# Patient Record
Sex: Male | Born: 1970 | Race: White | Hispanic: No | Smoking: Never smoker
Health system: Southern US, Community
[De-identification: ages and names within clinical notes are randomized; demographics above are authoritative.]

## PROBLEM LIST (undated history)

## (undated) DIAGNOSIS — B009 Herpesviral infection, unspecified: Secondary | ICD-10-CM

## (undated) DIAGNOSIS — R0683 Snoring: Secondary | ICD-10-CM

## (undated) DIAGNOSIS — J45909 Unspecified asthma, uncomplicated: Secondary | ICD-10-CM

## (undated) DIAGNOSIS — E7800 Pure hypercholesterolemia, unspecified: Secondary | ICD-10-CM

## (undated) DIAGNOSIS — E785 Hyperlipidemia, unspecified: Secondary | ICD-10-CM

## (undated) DIAGNOSIS — J302 Other seasonal allergic rhinitis: Secondary | ICD-10-CM

## (undated) DIAGNOSIS — N200 Calculus of kidney: Secondary | ICD-10-CM

## (undated) DIAGNOSIS — Z8249 Family history of ischemic heart disease and other diseases of the circulatory system: Secondary | ICD-10-CM

## (undated) DIAGNOSIS — I10 Essential (primary) hypertension: Secondary | ICD-10-CM

## (undated) DIAGNOSIS — E669 Obesity, unspecified: Secondary | ICD-10-CM

## (undated) HISTORY — DX: Hyperlipidemia, unspecified: E78.5

## (undated) HISTORY — PX: OTHER SURGICAL HISTORY: SHX169

## (undated) HISTORY — DX: Pure hypercholesterolemia, unspecified: E78.00

## (undated) HISTORY — DX: Snoring: R06.83

## (undated) HISTORY — DX: Obesity, unspecified: E66.9

## (undated) HISTORY — DX: Herpesviral infection, unspecified: B00.9

## (undated) HISTORY — DX: Essential (primary) hypertension: I10

## (undated) HISTORY — DX: Family history of ischemic heart disease and other diseases of the circulatory system: Z82.49

## (undated) HISTORY — DX: Other seasonal allergic rhinitis: J30.2

## (undated) HISTORY — DX: Unspecified asthma, uncomplicated: J45.909

---

## 1998-04-19 ENCOUNTER — Emergency Department (HOSPITAL_COMMUNITY): Admission: EM | Admit: 1998-04-19 | Discharge: 1998-04-20 | Payer: Self-pay | Admitting: Emergency Medicine

## 1999-10-26 ENCOUNTER — Other Ambulatory Visit: Admission: RE | Admit: 1999-10-26 | Discharge: 1999-10-26 | Payer: Self-pay | Admitting: Urology

## 2001-01-30 ENCOUNTER — Emergency Department (HOSPITAL_COMMUNITY): Admission: EM | Admit: 2001-01-30 | Discharge: 2001-01-30 | Payer: Self-pay | Admitting: Emergency Medicine

## 2003-04-05 ENCOUNTER — Emergency Department (HOSPITAL_COMMUNITY): Admission: EM | Admit: 2003-04-05 | Discharge: 2003-04-05 | Payer: Self-pay | Admitting: Emergency Medicine

## 2004-02-28 ENCOUNTER — Emergency Department (HOSPITAL_COMMUNITY): Admission: EM | Admit: 2004-02-28 | Discharge: 2004-02-28 | Payer: Self-pay | Admitting: Emergency Medicine

## 2006-12-13 ENCOUNTER — Emergency Department (HOSPITAL_COMMUNITY): Admission: EM | Admit: 2006-12-13 | Discharge: 2006-12-13 | Payer: Self-pay | Admitting: Emergency Medicine

## 2006-12-13 ENCOUNTER — Encounter (INDEPENDENT_AMBULATORY_CARE_PROVIDER_SITE_OTHER): Payer: Self-pay | Admitting: Emergency Medicine

## 2008-09-04 ENCOUNTER — Emergency Department (HOSPITAL_COMMUNITY): Admission: EM | Admit: 2008-09-04 | Discharge: 2008-09-04 | Payer: Self-pay | Admitting: Emergency Medicine

## 2010-04-16 IMAGING — CT CT ABDOMEN W/O CM
2 of 4 series · 17 of 46 positions shown, 19 images · non-contrast
Comparison: None

CT ABDOMEN

CLINICAL DATA: Left flank pain

CT OF THE ABDOMEN AND PELVIS WITHOUT CONTRAST (CT UROGRAM)
TECHNIQUE: Multidetector CT imaging was performed through the
abdomen and pelvis to include the urinary tract.

[Series 2: stone_wo 5.0 b40f st · axial · 0.75mm/px · z∈[-420,-60]mm · 14 of 78 slices shown, 16 images]
[im 3/78  soft-tissue]
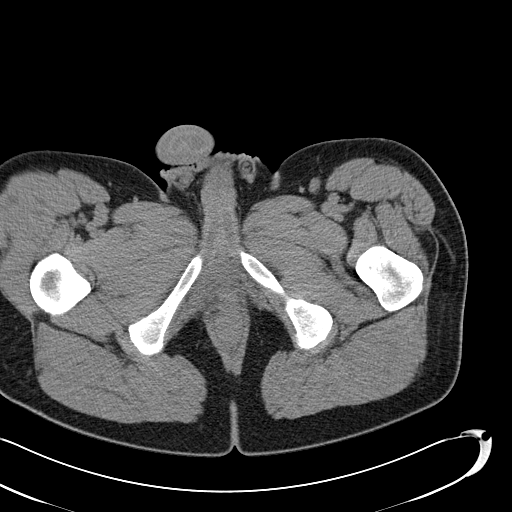
[im 3/78  bone]
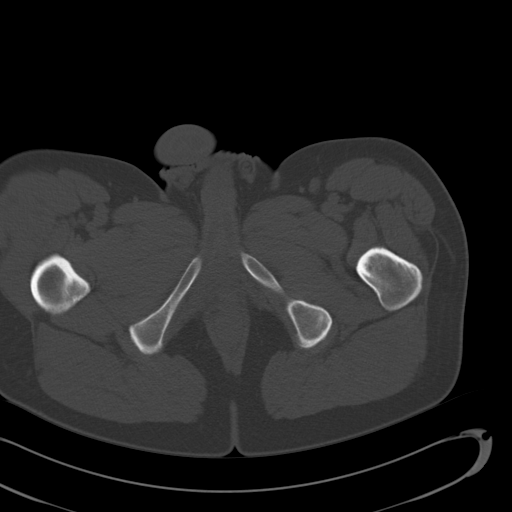
[im 9/78  soft-tissue]
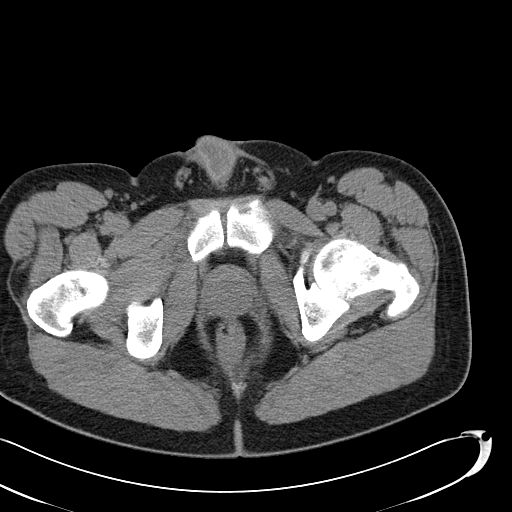
[im 14/78  soft-tissue]
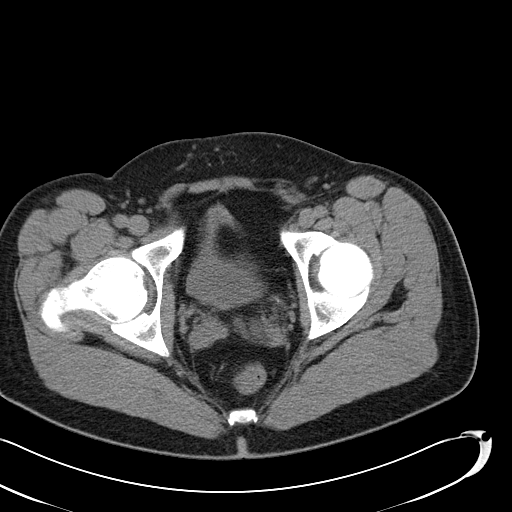
[im 20/78  soft-tissue]
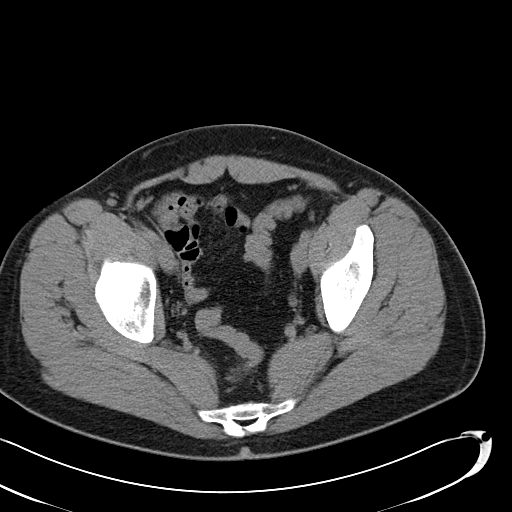
[im 25/78  soft-tissue]
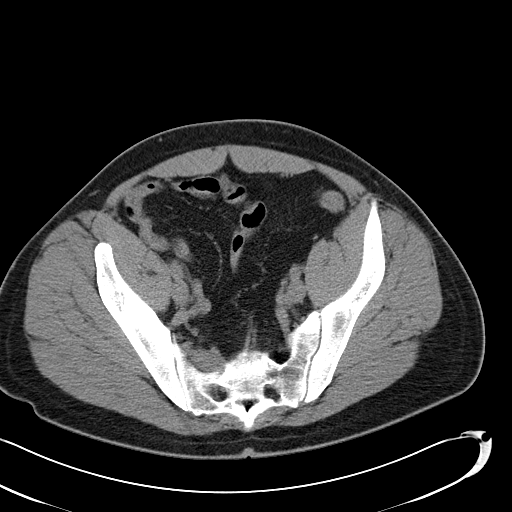
[im 31/78  soft-tissue]
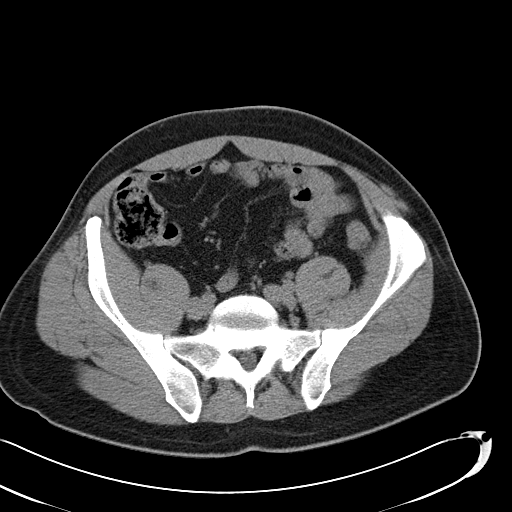
[im 36/78  soft-tissue]
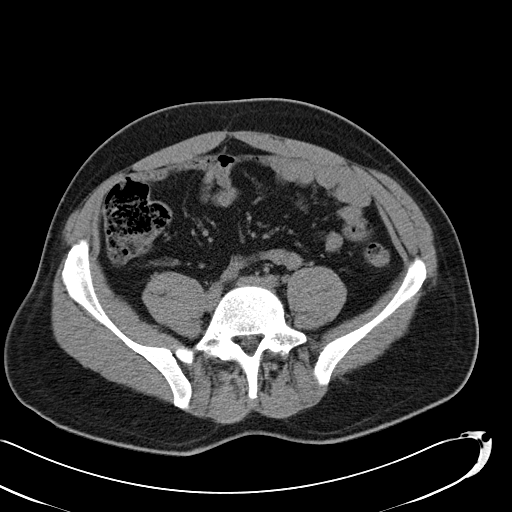
[im 42/78  soft-tissue]
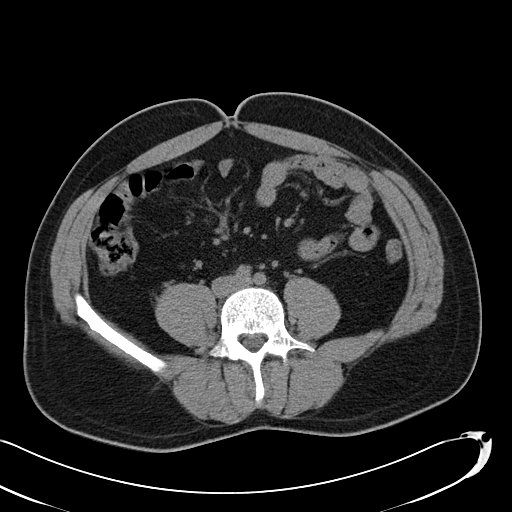
[im 47/78  soft-tissue]
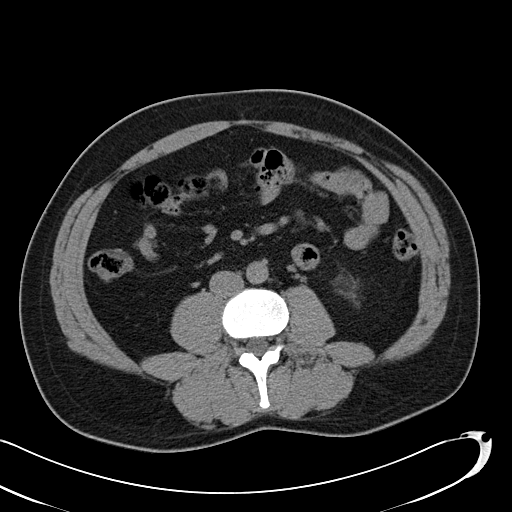
[im 47/78  bone]
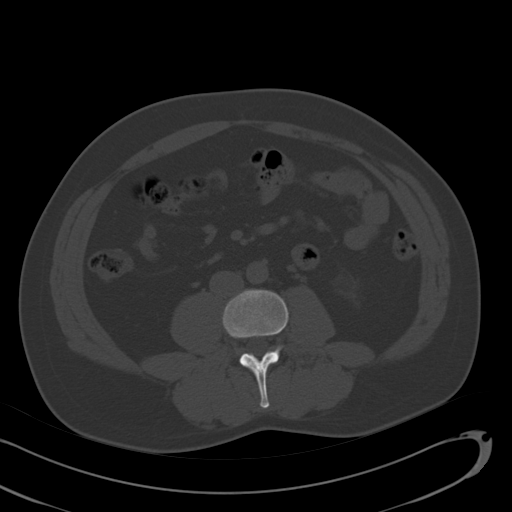
[im 53/78  soft-tissue]
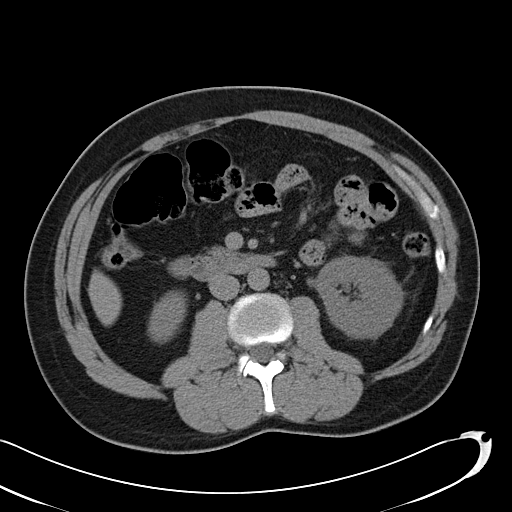
[im 58/78  soft-tissue]
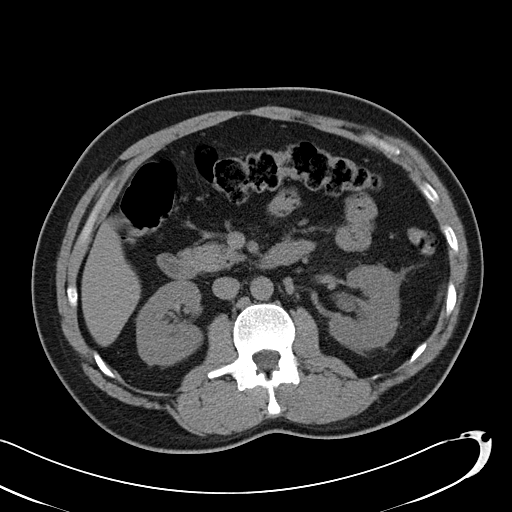
[im 64/78  soft-tissue]
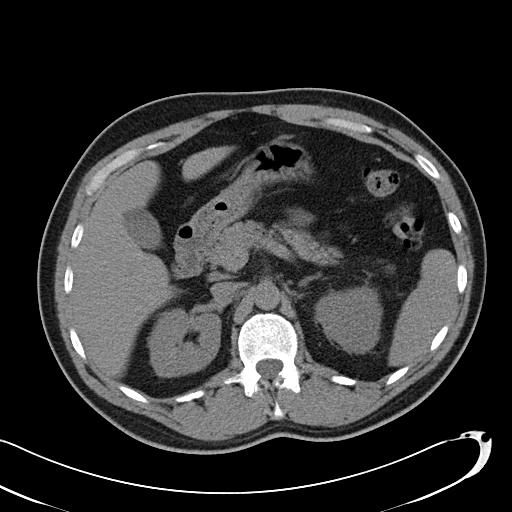
[im 69/78  soft-tissue]
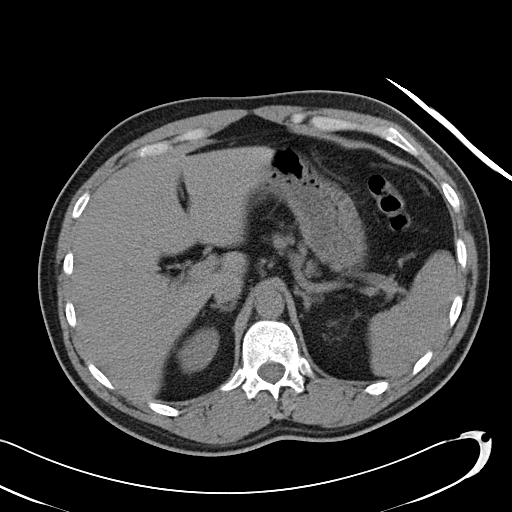
[im 75/78  soft-tissue]
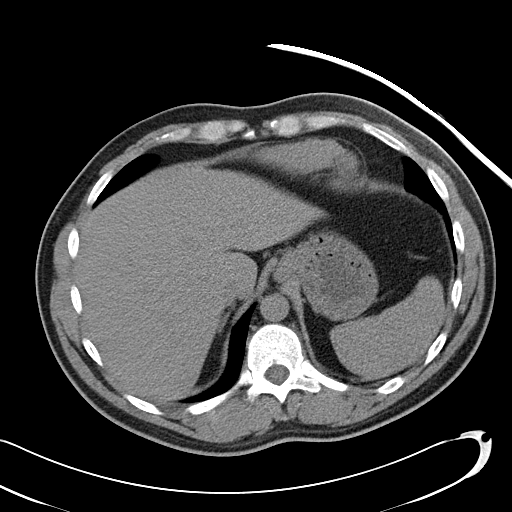

[Series 602: coronal · coronal · 0.79mm/px · 3 of 72 slices shown]
[im 24/72  soft-tissue]
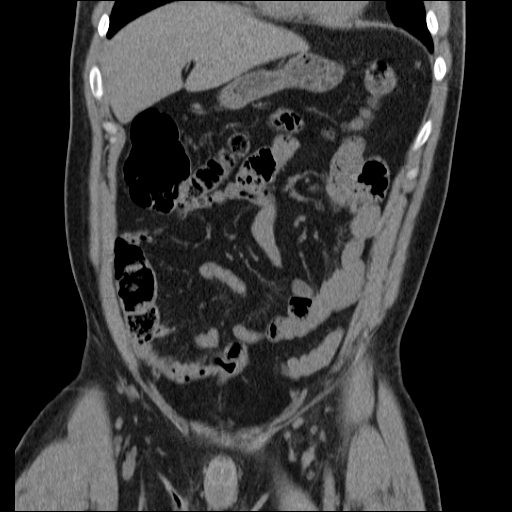
[im 32/72  soft-tissue]
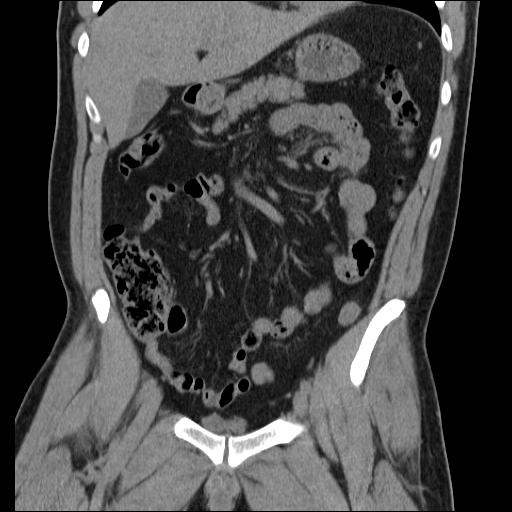
[im 40/72  soft-tissue]
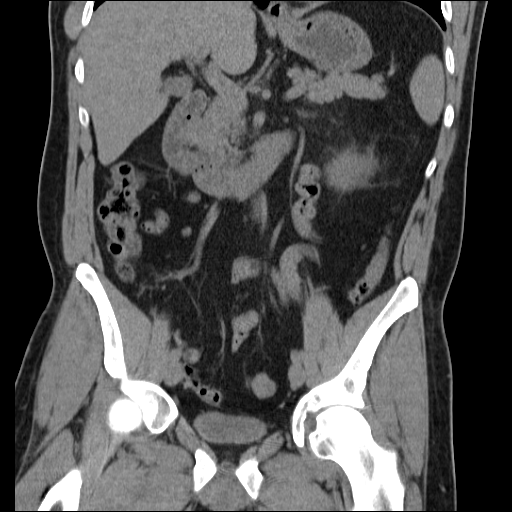

[17 of 46 positions shown; findings below may reference images not displayed]

FINDINGS: Mild to moderate left hydroureteronephrosis.  Findings
are compatible with a distal left ureteral calculus.  No renal
parenchymal abnormality.  Left perirenal soft tissue stranding
secondary to the obstruction.
IMPRESSION: Acute left urinary tract obstruction.  Findings consistent with a
distal left ureteral stone.  CT pelvis to follow.

CT PELVIS
FINDINGS: 3 mm calculus in the distal left ureter (image 65).
Prostate gland and seminal vesicles unremarkable.
IMPRESSION: Small calculus in the distal left ureter.

## 2010-09-08 ENCOUNTER — Emergency Department (HOSPITAL_COMMUNITY)
Admission: EM | Admit: 2010-09-08 | Discharge: 2010-09-08 | Disposition: A | Discharge status: Home / Self Care | Payer: BC Managed Care – PPO | Attending: Emergency Medicine | Admitting: Emergency Medicine

## 2010-09-08 DIAGNOSIS — W57XXXA Bitten or stung by nonvenomous insect and other nonvenomous arthropods, initial encounter: Secondary | ICD-10-CM | POA: Insufficient documentation

## 2010-09-08 DIAGNOSIS — S1096XA Insect bite of unspecified part of neck, initial encounter: Secondary | ICD-10-CM | POA: Insufficient documentation

## 2010-09-08 DIAGNOSIS — S30860A Insect bite (nonvenomous) of lower back and pelvis, initial encounter: Secondary | ICD-10-CM | POA: Insufficient documentation

## 2010-09-08 DIAGNOSIS — S40269A Insect bite (nonvenomous) of unspecified shoulder, initial encounter: Secondary | ICD-10-CM | POA: Insufficient documentation

## 2010-09-20 LAB — URINE MICROSCOPIC-ADD ON

## 2010-09-20 LAB — URINALYSIS, ROUTINE W REFLEX MICROSCOPIC
Bilirubin Urine: NEGATIVE
Glucose, UA: NEGATIVE mg/dL
Leukocytes, UA: NEGATIVE
Nitrite: NEGATIVE
Protein, ur: 30 mg/dL — AB
Specific Gravity, Urine: 1.028 (ref 1.005–1.030)
Urobilinogen, UA: 1 mg/dL (ref 0.0–1.0)
pH: 7 (ref 5.0–8.0)

## 2011-07-30 ENCOUNTER — Telehealth: Payer: Self-pay

## 2011-07-30 NOTE — Telephone Encounter (Signed)
.  [facility]     PT IS REQUESTING REFILL FOR HERPES   BEST PHONE [phone]

## 2011-07-30 NOTE — Telephone Encounter (Signed)
Spoke with patient, needs his refill on Valtrex.  Last seen in August for CPE.  Please rx to [facility], and contact patient.

## 2011-07-31 ENCOUNTER — Other Ambulatory Visit: Payer: Self-pay

## 2011-07-31 MED ORDER — VALACYCLOVIR HCL 500 MG PO TABS: 500.0000 mg | ORAL_TABLET | Freq: Two times a day (BID) | ORAL | Status: DC

## 2011-07-31 NOTE — Telephone Encounter (Signed)
Please pull chart and re-route to PA pool  

## 2011-07-31 NOTE — Telephone Encounter (Signed)
Chart already pulled and taken to clinical station for fax rx request.

## 2011-08-09 ENCOUNTER — Telehealth: Payer: Self-pay

## 2011-08-09 NOTE — Telephone Encounter (Signed)
Pt has some questions about herpes he would like answered

## 2011-08-10 NOTE — Telephone Encounter (Signed)
PT WAS CONCERNED ABOUT HOW HERPES IS CONTRACTED.  EXPLANATION GIVEN.  PT WAS PRESCRIBED VIAGRA IN AUGUST AND WOULD LIKE TO TRY CIALIS INSTEAD. PLEASE ADVISE.  CHART IS AT NURSES STATION FOR REVIEW

## 2011-08-12 NOTE — Telephone Encounter (Signed)
Can switch to cialis 10 mg take 1 one hour prior to intercourse #10

## 2011-08-13 NOTE — Telephone Encounter (Signed)
Advised pt that we can call in Rx for Cialis for him and verified pharmacy. Called into [facility] as written below by Helmut Muster

## 2011-10-30 ENCOUNTER — Telehealth: Payer: Self-pay

## 2011-10-30 NOTE — Telephone Encounter (Signed)
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 After researching the disease online he became concerned that it may cause cancer. Please advise.

## 2011-10-31 NOTE — Telephone Encounter (Signed)
Dr Georgiana Shore, you saw the pt for CPE 01/10/11, and his chart is in your box. What would you like me to tell the pt?

## 2011-10-31 NOTE — Telephone Encounter (Signed)
Herpes does not cause cancer.

## 2011-11-01 NOTE — Telephone Encounter (Signed)
Pt CB and I gave him the message from Dr Georgiana Shore. He thanked [place] for the call and info

## 2011-11-01 NOTE — Telephone Encounter (Signed)
LMOM to CB. 

## 2012-03-10 ENCOUNTER — Other Ambulatory Visit: Payer: Self-pay | Admitting: Family Medicine

## 2012-03-10 NOTE — Telephone Encounter (Signed)
Chart pulled to PA pool at nurse station DOS 01/10/11

## 2013-09-26 ENCOUNTER — Other Ambulatory Visit: Payer: Self-pay | Admitting: Family Medicine

## 2014-04-30 ENCOUNTER — Encounter (HOSPITAL_COMMUNITY): Payer: Self-pay | Admitting: Nurse Practitioner

## 2014-04-30 ENCOUNTER — Emergency Department (HOSPITAL_COMMUNITY)
Admission: EM | Admit: 2014-04-30 | Discharge: 2014-04-30 | Disposition: A | Discharge status: Home / Self Care | Payer: BC Managed Care – PPO | Attending: Emergency Medicine | Admitting: Emergency Medicine

## 2014-04-30 ENCOUNTER — Emergency Department (HOSPITAL_COMMUNITY): Payer: BC Managed Care – PPO

## 2014-04-30 DIAGNOSIS — N200 Calculus of kidney: Secondary | ICD-10-CM | POA: Insufficient documentation

## 2014-04-30 DIAGNOSIS — R001 Bradycardia, unspecified: Secondary | ICD-10-CM | POA: Diagnosis not present

## 2014-04-30 DIAGNOSIS — R109 Unspecified abdominal pain: Secondary | ICD-10-CM

## 2014-04-30 DIAGNOSIS — R1031 Right lower quadrant pain: Secondary | ICD-10-CM | POA: Diagnosis present

## 2014-04-30 DIAGNOSIS — Z8619 Personal history of other infectious and parasitic diseases: Secondary | ICD-10-CM | POA: Insufficient documentation

## 2014-04-30 HISTORY — DX: Herpesviral infection, unspecified: B00.9

## 2014-04-30 HISTORY — DX: Calculus of kidney: N20.0

## 2014-04-30 LAB — COMPREHENSIVE METABOLIC PANEL
ALT: 42 U/L (ref 0–53)
AST: 31 U/L (ref 0–37)
Albumin: 4 g/dL (ref 3.5–5.2)
Alkaline Phosphatase: 85 U/L (ref 39–117)
Anion gap: 14 (ref 5–15)
BUN: 13 mg/dL (ref 6–23)
CO2: 25 mEq/L (ref 19–32)
Calcium: 9.2 mg/dL (ref 8.4–10.5)
Chloride: 102 mEq/L (ref 96–112)
Creatinine, Ser: 1.19 mg/dL (ref 0.50–1.35)
GFR calc Af Amer: 86 mL/min — ABNORMAL LOW (ref >90)
GFR calc non Af Amer: 74 mL/min — ABNORMAL LOW (ref >90)
Glucose, Bld: 117 mg/dL — ABNORMAL HIGH (ref 70–99)
Potassium: 3.9 mEq/L (ref 3.7–5.3)
Sodium: 141 mEq/L (ref 137–147)
Total Bilirubin: 0.8 mg/dL (ref 0.3–1.2)
Total Protein: 7.4 g/dL (ref 6.0–8.3)

## 2014-04-30 LAB — CBC WITH DIFFERENTIAL/PLATELET
Basophils Absolute: 0 10*3/uL (ref 0.0–0.1)
Basophils Relative: 1 % (ref 0–1)
Eosinophils Absolute: 0.3 10*3/uL (ref 0.0–0.7)
Eosinophils Relative: 5 % (ref 0–5)
HCT: 45.6 % (ref 39.0–52.0)
Hemoglobin: 16 g/dL (ref 13.0–17.0)
Lymphocytes Relative: 35 % (ref 12–46)
Lymphs Abs: 2.2 10*3/uL (ref 0.7–4.0)
MCH: 33.8 pg (ref 26.0–34.0)
MCHC: 35.1 g/dL (ref 30.0–36.0)
MCV: 96.2 fL (ref 78.0–100.0)
Monocytes Absolute: 0.5 10*3/uL (ref 0.1–1.0)
Monocytes Relative: 8 % (ref 3–12)
Neutro Abs: 3.3 10*3/uL (ref 1.7–7.7)
Neutrophils Relative %: 51 % (ref 43–77)
Platelets: 230 10*3/uL (ref 150–400)
RBC: 4.74 MIL/uL (ref 4.22–5.81)
RDW: 12.7 % (ref 11.5–15.5)
WBC: 6.4 10*3/uL (ref 4.0–10.5)

## 2014-04-30 LAB — URINE MICROSCOPIC-ADD ON

## 2014-04-30 LAB — URINALYSIS, ROUTINE W REFLEX MICROSCOPIC
Glucose, UA: NEGATIVE mg/dL
Ketones, ur: 15 mg/dL — AB
Leukocytes, UA: NEGATIVE
Nitrite: NEGATIVE
Protein, ur: 30 mg/dL — AB
Specific Gravity, Urine: 1.034 — ABNORMAL HIGH (ref 1.005–1.030)
Urobilinogen, UA: 1 mg/dL (ref 0.0–1.0)
pH: 5.5 (ref 5.0–8.0)

## 2014-04-30 MED ORDER — ONDANSETRON HCL 4 MG/2ML IJ SOLN
4.0000 mg | Freq: Once | INTRAMUSCULAR | Status: AC
  Administered 2014-04-30: 4 mg via INTRAVENOUS
  Filled 2014-04-30: qty 2

## 2014-04-30 MED ORDER — KETOROLAC TROMETHAMINE 30 MG/ML IJ SOLN
30.0000 mg | Freq: Once | INTRAMUSCULAR | Status: AC
  Administered 2014-04-30: 30 mg via INTRAVENOUS
  Filled 2014-04-30: qty 1

## 2014-04-30 MED ORDER — IBUPROFEN 600 MG PO TABS: 600.0000 mg | ORAL_TABLET | Freq: Four times a day (QID) | ORAL | Status: DC | PRN

## 2014-04-30 MED ORDER — HYDROMORPHONE HCL 1 MG/ML IJ SOLN
1.0000 mg | Freq: Once | INTRAMUSCULAR | Status: AC
  Administered 2014-04-30: 1 mg via INTRAVENOUS
  Filled 2014-04-30: qty 1

## 2014-04-30 MED ORDER — ONDANSETRON HCL 4 MG/2ML IJ SOLN
INTRAMUSCULAR | Status: AC
  Filled 2014-04-30: qty 2

## 2014-04-30 MED ORDER — OXYCODONE-ACETAMINOPHEN 5-325 MG PO TABS: 1.0000 | ORAL_TABLET | Freq: Four times a day (QID) | ORAL | Status: DC | PRN

## 2014-04-30 MED ORDER — ONDANSETRON HCL 4 MG PO TABS: 4.0000 mg | ORAL_TABLET | Freq: Four times a day (QID) | ORAL | Status: DC

## 2014-04-30 NOTE — ED Provider Notes (Signed)
CSN: [id number]     Arrival date & time 04/30/14  1507 History   First MD Initiated Contact with Patient 04/30/14 1516     Chief Complaint  Patient presents with  . Abdominal Pain     (Consider location/radiation/quality/duration/timing/severity/associated sxs/prior Treatment) HPI   Patient to the ER with complaints of RLQ abdominal pain, radiates down into the left testicle, that started 1 hour prior to arrival. It is associated with vomiting and feels like a previous kidney stone that he has had on the right side as well. He has not had any chest pain, diarrhea constipation, dysuria, penile discharge. He denies having intra-abdominal surgery in the past.   Past Medical History  Diagnosis Date  . Kidney stone   . Herpes    History reviewed. No pertinent past surgical history. History reviewed. No pertinent family history. History  Substance Use Topics  . Smoking status: Never Smoker   . Smokeless tobacco: Not on file  . Alcohol Use: Yes    Review of Systems  10 Systems reviewed and are negative for acute change except as noted in the HPI.    Allergies  Review of patient's allergies indicates no known allergies.  Home Medications   Prior to Admission medications   Medication Sig Start Date End Date Taking? Authorizing Provider  naproxen sodium (ANAPROX) 220 MG tablet Take 440 mg by mouth once.   Yes Historical Provider, MD  ibuprofen (ADVIL,MOTRIN) 600 MG tablet Take 1 tablet (600 mg total) by mouth every 6 (six) hours as needed. 04/30/14   Dorse Locy Irine SealG Persis Graffius, PA-C  ondansetron (ZOFRAN) 4 MG tablet Take 1 tablet (4 mg total) by mouth every 6 (six) hours. 04/30/14   Zurri Rudden Irine SealG Burrel Legrand, PA-C  oxyCODONE-acetaminophen (PERCOCET/ROXICET) 5-325 MG per tablet Take 1-2 tablets by mouth every 6 (six) hours as needed. 04/30/14   Siana Panameno Irine SealG Wasil Wolke, PA-C   BP 113/76 mmHg  Pulse 45  Temp(Src) 97.5 F (36.4 C) (Oral)  Resp 15  SpO2 98% Physical Exam  Constitutional: He appears  well-developed and well-nourished. He appears distressed (crying with pain).  HENT:  Head: Normocephalic and atraumatic.  Eyes: Pupils are equal, round, and reactive to light.  Neck: Normal range of motion. Neck supple.  Cardiovascular: Regular rhythm.  Bradycardia present.   Pulmonary/Chest: Effort normal.  Abdominal: Soft. Bowel sounds are normal. He exhibits no distension and no fluid wave. There is tenderness. There is CVA tenderness (right). There is no guarding.    Neurological: He is alert.  Skin: Skin is warm and dry.  Nursing note and vitals reviewed.     ED Course  Procedures (including critical care time) Labs Review Labs Reviewed  COMPREHENSIVE METABOLIC PANEL - Abnormal; Notable for the following:    Glucose, Bld 117 (*)    GFR calc non Af Amer 74 (*)    GFR calc Af Amer 86 (*)    All other components within normal limits  URINALYSIS, ROUTINE W REFLEX MICROSCOPIC - Abnormal; Notable for the following:    Color, Urine AMBER (*)    APPearance TURBID (*)    Specific Gravity, Urine 1.034 (*)    Hgb urine dipstick LARGE (*)    Bilirubin Urine SMALL (*)    Ketones, ur 15 (*)    Protein, ur 30 (*)    All other components within normal limits  URINE MICROSCOPIC-ADD ON - Abnormal; Notable for the following:    Bacteria, UA MANY (*)    All other components within normal limits  CBC WITH DIFFERENTIAL    Imaging Review Ct Abdomen Pelvis Wo Contrast  04/30/2014   CLINICAL DATA:  Acute onset right lower quadrant pain about 2 p.m. today.  EXAM: CT ABDOMEN AND PELVIS WITHOUT CONTRAST  TECHNIQUE: Multidetector CT imaging of the abdomen and pelvis was performed following the standard protocol without IV contrast.  COMPARISON:  09/04/2008  FINDINGS: Lung bases are clear.  No effusions.  Heart is normal size.  Liver, gallbladder, spleen, pancreas, adrenals have an unremarkable unenhanced appearance.  There is mild right hydronephrosis and perinephric stranding. Probable punctate  1 mm stone at the right ureterovesical junction. This is best seen on coronal image 50. Punctate 1-2 mm stone in the upper pole of the right kidney. No renal or ureteral stones on the left.  Appendix is visualized and is normal. Stomach, large and small bowel are unremarkable. No free fluid, free air or adenopathy.  No acute bony abnormality or focal bone lesion.  IMPRESSION: Mild right hydronephrosis and perinephric stranding. Probable punctate 1 mm stone at the right ureterovesical junction.  Right nephrolithiasis.   Electronically Signed   By: Charlett NoseKevin  Dover M.D.   On: 04/30/2014 17:08     EKG Interpretation   Date/Time:  Saturday April 30 2014 15:35:38 EST Ventricular Rate:  46 PR Interval:  191 QRS Duration: 95 QT Interval:  475 QTC Calculation: 415 R Axis:   56 Text Interpretation:  Sinus bradycardia Atrial premature complex Baseline  wander in lead(s) V4 No prior for comparison Confirmed by Gwendolyn GrantWALDEN  MD,  BLAIR ([id number]) on 04/30/2014 4:30:47 PM      MDM   Final diagnoses:  Right flank pain  Kidney stone    Patient has a small stone in the right UVJ ( 1 mm) with mild hydronephrosis. NO infection in his urine but he does have WBC's. Will culture urine.  Medications  ondansetron (ZOFRAN) injection 4 mg (4 mg Intravenous Given 04/30/14 1537)  HYDROmorphone (DILAUDID) injection 1 mg (1 mg Intravenous Given 04/30/14 1537)  ketorolac (TORADOL) 30 MG/ML injection 30 mg (30 mg Intravenous Given 04/30/14 1735)    Pain easily controlled in the ED.  ibuprofen (ADVIL,MOTRIN) 600 MG tablet Take 1 tablet (600 mg total) by mouth every 6 (six) hours as needed. 30 tablet Dorthula Matasiffany G Sakura Denis, PA-C   ondansetron (ZOFRAN) 4 MG tablet Take 1 tablet (4 mg total) by mouth every 6 (six) hours. 12 tablet Dorthula Matasiffany G Claribel Sachs, PA-C   oxyCODONE-acetaminophen (PERCOCET/ROXICET) 5-325 MG per tablet Take 1-2 tablets by mouth every 6 (six) hours as needed. 20 tablet Dorthula Matasiffany G Deonta Bomberger, PA-C   43 y.o.Austin RoundFrancis J  Ho's evaluation in the Emergency Department is complete. It has been determined that no acute conditions requiring further emergency intervention are present at this time. The patient/guardian have been advised of the diagnosis and plan. We have discussed signs and symptoms that warrant return to the ED, such as changes or worsening in symptoms.  Vital signs are stable at discharge. Filed Vitals:   04/30/14 1815  BP: 113/76  Pulse: 45  Temp:   Resp: 15    Patient/guardian has voiced understanding and agreed to follow-up with the PCP or specialist.      Dorthula Matasiffany G Amberrose Friebel, PA-C 04/30/14 1838  Elwin MochaBlair Walden, MD 04/30/14 2150

## 2014-04-30 NOTE — Discharge Instructions (Signed)

## 2014-04-30 NOTE — ED Notes (Signed)
He reports sudden onset RLQ pain and vomiting 1 hour ago. Feels like when he had a kidney stone

## 2015-04-18 ENCOUNTER — Encounter (HOSPITAL_COMMUNITY): Payer: Self-pay | Admitting: Emergency Medicine

## 2015-04-18 ENCOUNTER — Emergency Department (HOSPITAL_COMMUNITY)
Admission: EM | Admit: 2015-04-18 | Discharge: 2015-04-18 | Disposition: A | Discharge status: Home / Self Care | Payer: BLUE CROSS/BLUE SHIELD | Attending: Emergency Medicine | Admitting: Emergency Medicine

## 2015-04-18 DIAGNOSIS — R109 Unspecified abdominal pain: Secondary | ICD-10-CM

## 2015-04-18 DIAGNOSIS — Z87442 Personal history of urinary calculi: Secondary | ICD-10-CM | POA: Insufficient documentation

## 2015-04-18 DIAGNOSIS — N390 Urinary tract infection, site not specified: Secondary | ICD-10-CM | POA: Diagnosis not present

## 2015-04-18 DIAGNOSIS — R1031 Right lower quadrant pain: Secondary | ICD-10-CM | POA: Diagnosis present

## 2015-04-18 DIAGNOSIS — Z8619 Personal history of other infectious and parasitic diseases: Secondary | ICD-10-CM | POA: Diagnosis not present

## 2015-04-18 DIAGNOSIS — Z79899 Other long term (current) drug therapy: Secondary | ICD-10-CM | POA: Insufficient documentation

## 2015-04-18 DIAGNOSIS — R319 Hematuria, unspecified: Secondary | ICD-10-CM | POA: Diagnosis not present

## 2015-04-18 LAB — COMPREHENSIVE METABOLIC PANEL
ALBUMIN: 3.9 g/dL (ref 3.5–5.0)
ALT: 68 U/L — AB (ref 17–63)
ANION GAP: 10 (ref 5–15)
AST: 42 U/L — ABNORMAL HIGH (ref 15–41)
Alkaline Phosphatase: 65 U/L (ref 38–126)
BUN: 17 mg/dL (ref 6–20)
CHLORIDE: 104 mmol/L (ref 101–111)
CO2: 26 mmol/L (ref 22–32)
CREATININE: 1.42 mg/dL — AB (ref 0.61–1.24)
Calcium: 8.9 mg/dL (ref 8.9–10.3)
GFR calc non Af Amer: 59 mL/min — ABNORMAL LOW (ref >60)
GLUCOSE: 126 mg/dL — AB (ref 65–99)
Potassium: 3.7 mmol/L (ref 3.5–5.1)
SODIUM: 140 mmol/L (ref 135–145)
Total Bilirubin: 1.2 mg/dL (ref 0.3–1.2)
Total Protein: 7 g/dL (ref 6.5–8.1)

## 2015-04-18 LAB — URINALYSIS, ROUTINE W REFLEX MICROSCOPIC
Glucose, UA: NEGATIVE mg/dL
Ketones, ur: 40 mg/dL — AB
Nitrite: POSITIVE — AB
PROTEIN: 100 mg/dL — AB
SPECIFIC GRAVITY, URINE: 1.037 — AB (ref 1.005–1.030)
UROBILINOGEN UA: 4 mg/dL — AB (ref 0.0–1.0)
pH: 5 (ref 5.0–8.0)

## 2015-04-18 LAB — CBC WITH DIFFERENTIAL/PLATELET
BASOS PCT: 0 %
Basophils Absolute: 0 10*3/uL (ref 0.0–0.1)
Eosinophils Absolute: 0.4 10*3/uL (ref 0.0–0.7)
Eosinophils Relative: 4 %
HCT: 45.9 % (ref 39.0–52.0)
Hemoglobin: 16 g/dL (ref 13.0–17.0)
LYMPHS PCT: 39 %
Lymphs Abs: 3.7 10*3/uL (ref 0.7–4.0)
MCH: 33.5 pg (ref 26.0–34.0)
MCHC: 34.9 g/dL (ref 30.0–36.0)
MCV: 96.2 fL (ref 78.0–100.0)
MONO ABS: 0.9 10*3/uL (ref 0.1–1.0)
MONOS PCT: 9 %
NEUTROS ABS: 4.4 10*3/uL (ref 1.7–7.7)
Neutrophils Relative %: 48 %
PLATELETS: 269 10*3/uL (ref 150–400)
RBC: 4.77 MIL/uL (ref 4.22–5.81)
RDW: 13 % (ref 11.5–15.5)
WBC: 9.4 10*3/uL (ref 4.0–10.5)

## 2015-04-18 LAB — URINE MICROSCOPIC-ADD ON

## 2015-04-18 LAB — LIPASE, BLOOD: Lipase: 39 U/L (ref 11–51)

## 2015-04-18 MED ORDER — FENTANYL CITRATE (PF) 100 MCG/2ML IJ SOLN
100.0000 ug | Freq: Once | INTRAMUSCULAR | Status: AC
  Administered 2015-04-18: 100 ug via INTRAVENOUS
  Filled 2015-04-18: qty 2

## 2015-04-18 MED ORDER — FENTANYL CITRATE (PF) 100 MCG/2ML IJ SOLN
INTRAMUSCULAR | Status: AC
  Administered 2015-04-18: 100 ug via INTRAVENOUS
  Filled 2015-04-18: qty 2

## 2015-04-18 MED ORDER — ONDANSETRON 4 MG PO TBDP
ORAL_TABLET | ORAL | Status: AC
  Filled 2015-04-18: qty 1

## 2015-04-18 MED ORDER — ONDANSETRON HCL 4 MG/2ML IJ SOLN
4.0000 mg | Freq: Once | INTRAMUSCULAR | Status: AC
  Administered 2015-04-18: 4 mg via INTRAVENOUS
  Filled 2015-04-18: qty 2

## 2015-04-18 MED ORDER — ONDANSETRON 4 MG PO TBDP
4.0000 mg | ORAL_TABLET | Freq: Once | ORAL | Status: AC
  Administered 2015-04-18: 4 mg via ORAL

## 2015-04-18 MED ORDER — SODIUM CHLORIDE 0.9 % IV BOLUS (SEPSIS)
1000.0000 mL | Freq: Once | INTRAVENOUS | Status: AC
  Administered 2015-04-18: 1000 mL via INTRAVENOUS

## 2015-04-18 MED ORDER — OXYCODONE-ACETAMINOPHEN 5-325 MG PO TABS: 1.0000 | ORAL_TABLET | Freq: Three times a day (TID) | ORAL | Status: AC | PRN

## 2015-04-18 MED ORDER — CEFTRIAXONE SODIUM 1 G IJ SOLR
1.0000 g | Freq: Once | INTRAMUSCULAR | Status: AC
  Administered 2015-04-18: 1 g via INTRAVENOUS
  Filled 2015-04-18: qty 10

## 2015-04-18 MED ORDER — OXYCODONE-ACETAMINOPHEN 5-325 MG PO TABS
1.0000 | ORAL_TABLET | Freq: Once | ORAL | Status: AC
  Administered 2015-04-18: 1 via ORAL

## 2015-04-18 MED ORDER — OXYCODONE-ACETAMINOPHEN 5-325 MG PO TABS
2.0000 | ORAL_TABLET | Freq: Once | ORAL | Status: DC
Start: 2015-04-18 — End: 2015-04-18

## 2015-04-18 MED ORDER — CIPROFLOXACIN HCL 500 MG PO TABS: 500.0000 mg | ORAL_TABLET | Freq: Two times a day (BID) | ORAL | Status: AC

## 2015-04-18 MED ORDER — OXYCODONE-ACETAMINOPHEN 5-325 MG PO TABS
ORAL_TABLET | ORAL | Status: AC
  Filled 2015-04-18: qty 1

## 2015-04-18 MED ORDER — FENTANYL CITRATE (PF) 100 MCG/2ML IJ SOLN
100.0000 ug | Freq: Once | INTRAMUSCULAR | Status: AC
  Administered 2015-04-18: 100 ug via INTRAVENOUS

## 2015-04-18 NOTE — ED Provider Notes (Signed)
CSN: [id number]     Arrival date & time 04/18/15  0302 History   First MD Initiated Contact with Patient 04/18/15 0456     Chief Complaint  Patient presents with  . Flank Pain     Patient is a 44 y.o. male presenting with flank pain. The history is provided by the patient.  Flank Pain This is a new problem. Episode onset: several hours ago. The problem occurs constantly. The problem has been gradually worsening. Associated symptoms include abdominal pain. Pertinent negatives include no chest pain and no shortness of breath. Nothing aggravates the symptoms. Nothing relieves the symptoms.  pt reports onset of right flank pain that radiates into right abdomen He reports nausea/vomiting He reports dysuria He reports feels similar to prior kidney stones No h/o urologic procedures No fever reported While waiting in the ER he decided to lay on floor to "cool off" but denies injury/syncope   Past Medical History  Diagnosis Date  . Kidney stone   . Herpes    History reviewed. No pertinent past surgical history. No family history on file. Social History  Substance Use Topics  . Smoking status: Never Smoker   . Smokeless tobacco: None  . Alcohol Use: Yes    Review of Systems  Constitutional: Negative for fever.  Respiratory: Negative for shortness of breath.   Cardiovascular: Negative for chest pain.  Gastrointestinal: Positive for nausea, vomiting and abdominal pain. Negative for diarrhea.  Genitourinary: Positive for dysuria and flank pain. Negative for testicular pain.  Neurological: Negative for syncope.  All other systems reviewed and are negative.     Allergies  Review of patient's allergies indicates no known allergies.  Home Medications   Prior to Admission medications   Medication Sig Start Date End Date Taking? Authorizing Provider  Influenza vac split quadrivalent PF (FLUZONE QUADRIVALENT) 0.5 ML injection Inject 0.5 mLs into the muscle once.   Yes Historical  Provider, MD  ibuprofen (ADVIL,MOTRIN) 600 MG tablet Take 1 tablet (600 mg total) by mouth every 6 (six) hours as needed. Patient not taking: Reported on 04/18/2015 04/30/14   Marlon Pel, PA-C  ondansetron (ZOFRAN) 4 MG tablet Take 1 tablet (4 mg total) by mouth every 6 (six) hours. Patient not taking: Reported on 04/18/2015 04/30/14   Marlon Pel, PA-C  oxyCODONE-acetaminophen (PERCOCET/ROXICET) 5-325 MG per tablet Take 1-2 tablets by mouth every 6 (six) hours as needed. Patient not taking: Reported on 04/18/2015 04/30/14   Marlon Pel, PA-C   BP 117/68 mmHg  Pulse 42  Temp(Src) 98.6 F (37 C) (Oral)  Resp 16  Ht  (1.753 m)  Wt 175 lb (79.379 kg)  BMI 25.83 kg/m2  SpO2 96% Physical Exam CONSTITUTIONAL: Well developed/well nourished HEAD: Normocephalic/atraumatic EYES: EOMI/PERRL ENMT: Mucous membranes moist NECK: supple no meningeal signs SPINE/BACK:entire spine nontender CV: S1/S2 noted, no murmurs/rubs/gallops noted LUNGS: Lungs are clear to auscultation bilaterally, no apparent distress ABDOMEN: soft, mild RLQ tenderness, no rebound or guarding, bowel sounds noted throughout abdomen GU:no cva tenderness NEURO: Pt is awake/alert/appropriate, moves all extremitiesx4.  No facial droop.   EXTREMITIES: pulses normal/equal, full ROM SKIN: warm, color normal PSYCH: no abnormalities of mood noted, alert and oriented to situation  ED Course  Procedures  Medications  ondansetron (ZOFRAN-ODT) disintegrating tablet 4 mg ( Oral Duplicate 04/18/15 0416)  oxyCODONE-acetaminophen (PERCOCET/ROXICET) 5-325 MG per tablet 1 tablet (1 tablet Oral Given 04/18/15 0335)  fentaNYL (SUBLIMAZE) injection 100 mcg (100 mcg Intravenous Given 04/18/15 0427)  ondansetron (ZOFRAN) injection 4  mg (4 mg Intravenous Given 04/18/15 0426)  sodium chloride 0.9 % bolus 1,000 mL (0 mLs Intravenous Stopped 04/18/15 0646)  cefTRIAXone (ROCEPHIN) 1 g in dextrose 5 % 50 mL IVPB (0 g Intravenous Stopped  04/18/15 0544)  fentaNYL (SUBLIMAZE) injection 100 mcg (100 mcg Intravenous Given 04/18/15 0520)    6:31 AM Pt improved Reports symptoms similar to prior kidney stones He is well appearing Urine culture sent U/a has some features of UTI Will start on rocephin but as pain is improved, will defer CT imaging at this time Will d/c home with pain meds, antibiotics and urology f/u Nursing ordered EKG due to bradycardia but no CP reported 6:51 AM Pt stable Not septic appearing Will refer to urology  Labs Review Labs Reviewed  COMPREHENSIVE METABOLIC PANEL - Abnormal; Notable for the following:    Glucose, Bld 126 (*)    Creatinine, Ser 1.42 (*)    AST 42 (*)    ALT 68 (*)    GFR calc non Af Amer 59 (*)    All other components within normal limits  URINALYSIS, ROUTINE W REFLEX MICROSCOPIC (NOT [facility]) - Abnormal; Notable for the following:    Color, Urine ORANGE (*)    APPearance CLOUDY (*)    Specific Gravity, Urine 1.037 (*)    Hgb urine dipstick LARGE (*)    Bilirubin Urine MODERATE (*)    Ketones, ur 40 (*)    Protein, ur 100 (*)    Urobilinogen, UA 4.0 (*)    Nitrite POSITIVE (*)    Leukocytes, UA SMALL (*)    All other components within normal limits  URINE MICROSCOPIC-ADD ON - Abnormal; Notable for the following:    Bacteria, UA FEW (*)    All other components within normal limits  URINE CULTURE  CBC WITH DIFFERENTIAL/PLATELET  LIPASE, BLOOD    I have personally reviewed and evaluated these lab results as part of my medical decision-making.   EKG Interpretation   Date/Time:  Tuesday April 18 2015 04:54:07 EST Ventricular Rate:  44 PR Interval:  186 QRS Duration: 93 QT Interval:  467 QTC Calculation: 399 R Axis:   89 Text Interpretation:  Sinus bradycardia ST elev, probable normal early  repol pattern No significant change since last tracing Confirmed by  Bebe ShaggyWICKLINE  MD, Jaislyn Blinn ([id number]) on 04/18/2015 4:57:35 AM      MDM   Final diagnoses:  Flank pain   Hematuria  UTI (lower urinary tract infection)    Nursing notes including past medical history and social history reviewed and considered in documentation Labs/vital reviewed myself and considered during evaluation     Austin Rhineonald Charlyne Robertshaw, MD 04/18/15 [phone]

## 2015-04-18 NOTE — ED Notes (Signed)
C/O R sided flank pain. No fevers. Pt reports having issues with kidney stones in the past 2 years. Pt taking otc medication for UTI. Pt presents with emesis. Pt tearful on exam. Pt a&o.

## 2015-04-18 NOTE — ED Notes (Addendum)
Pt found in the waiting room bathroom on the ground. Pt was alert and oriented but c/o intense flank pain. Per pt, he laid himself in the bathroom floor and pulled the call string. Denies hitting his head and LOC.

## 2015-04-19 LAB — URINE CULTURE: CULTURE: NO GROWTH

## 2016-05-22 ENCOUNTER — Other Ambulatory Visit: Payer: Self-pay | Admitting: Cardiology

## 2016-05-22 ENCOUNTER — Ambulatory Visit
Admission: RE | Admit: 2016-05-22 | Discharge: 2016-05-22 | Disposition: A | Discharge status: Home / Self Care | Payer: BLUE CROSS/BLUE SHIELD | Source: Ambulatory Visit | Attending: Cardiology | Admitting: Cardiology

## 2016-05-22 DIAGNOSIS — R0602 Shortness of breath: Secondary | ICD-10-CM

## 2019-11-16 ENCOUNTER — Ambulatory Visit (INDEPENDENT_AMBULATORY_CARE_PROVIDER_SITE_OTHER): Payer: BLUE CROSS/BLUE SHIELD | Admitting: Otolaryngology

## 2019-11-17 ENCOUNTER — Ambulatory Visit (INDEPENDENT_AMBULATORY_CARE_PROVIDER_SITE_OTHER): Payer: BLUE CROSS/BLUE SHIELD | Admitting: Otolaryngology

## 2021-06-26 DIAGNOSIS — Z1211 Encounter for screening for malignant neoplasm of colon: Secondary | ICD-10-CM | POA: Diagnosis not present

## 2021-06-26 DIAGNOSIS — D120 Benign neoplasm of cecum: Secondary | ICD-10-CM | POA: Diagnosis not present

## 2021-06-26 DIAGNOSIS — D122 Benign neoplasm of ascending colon: Secondary | ICD-10-CM | POA: Diagnosis not present

## 2022-04-24 DIAGNOSIS — B029 Zoster without complications: Secondary | ICD-10-CM | POA: Diagnosis not present

## 2022-05-06 DIAGNOSIS — Z23 Encounter for immunization: Secondary | ICD-10-CM | POA: Diagnosis not present

## 2022-05-06 DIAGNOSIS — E7800 Pure hypercholesterolemia, unspecified: Secondary | ICD-10-CM | POA: Diagnosis not present

## 2022-05-06 DIAGNOSIS — I10 Essential (primary) hypertension: Secondary | ICD-10-CM | POA: Diagnosis not present

## 2022-05-06 DIAGNOSIS — J45909 Unspecified asthma, uncomplicated: Secondary | ICD-10-CM | POA: Diagnosis not present

## 2023-06-07 ENCOUNTER — Ambulatory Visit: Payer: Self-pay

## 2023-09-22 ENCOUNTER — Ambulatory Visit: Payer: BC Managed Care – PPO | Attending: Internal Medicine | Admitting: Internal Medicine

## 2024-01-14 NOTE — Progress Notes (Unsigned)
  Cardiology Office Note:   Date:  01/14/2024  ID:  Austin Ho, DOB [birth year], MRN [id number] PCP: Patient, No Pcp Per  [facility] Providers Cardiologist:  None {  History of Present Illness:   Austin Ho is a 53 y.o. male with a family history of early CAD.    ROS: ***  Studies Reviewed:    EKG:       ***  Risk Assessment/Calculations:   {Does this patient have ATRIAL FIBRILLATION?[phone]} No BP recorded.  {Refresh Note OR Click here to enter BP  :1}***        Physical Exam:   VS:  There were no vitals taken for this visit.   Wt Readings from Last 3 Encounters:  04/18/15 175 lb (79.4 kg)     GEN: Well nourished, well developed in no acute distress NECK: No JVD; No carotid bruits CARDIAC: ***RR, *** murmurs, rubs, gallops RESPIRATORY:  Clear to auscultation without rales, wheezing or rhonchi  ABDOMEN: Soft, non-tender, non-distended EXTREMITIES:  No edema; No deformity   ASSESSMENT AND PLAN:   ***    Family History of CAD:  ***   Follow up ***  Signed, Lynwood Schilling, MD

## 2024-01-15 ENCOUNTER — Encounter: Payer: Self-pay | Admitting: Cardiology

## 2024-01-15 ENCOUNTER — Ambulatory Visit: Attending: Cardiology | Admitting: Cardiology

## 2024-01-15 VITALS — BP 120/88 | HR 57 | Ht 68.0 in | Wt 196.1 lb

## 2024-01-15 DIAGNOSIS — R9431 Abnormal electrocardiogram [ECG] [EKG]: Secondary | ICD-10-CM

## 2024-01-15 DIAGNOSIS — Z8249 Family history of ischemic heart disease and other diseases of the circulatory system: Secondary | ICD-10-CM

## 2024-01-15 DIAGNOSIS — Z7689 Persons encountering health services in other specified circumstances: Secondary | ICD-10-CM | POA: Diagnosis not present

## 2024-01-15 NOTE — Patient Instructions (Addendum)
 Medication Instructions:  Your physician recommends that you continue on your current medications as directed. Please refer to the Current Medication list given to you today.  *If you need a refill on your cardiac medications before your next appointment, please call your pharmacy*  Lab Work: NONE If you have labs (blood work) drawn today and your tests are completely normal, you will receive your results only by: MyChart Message (if you have MyChart) OR A paper copy in the mail If you have any lab test that is abnormal or we need to change your treatment, we will call you to review the results.  Testing/Procedures: Calcium Score today Your physician has requested that you have a coronary calcium score performed. This is not covered by insurance and will be an out-of-pocket cost of approximately $99.   Follow-Up: At [facility], you and your health needs are our priority.  As part of our continuing mission to provide you with exceptional heart care, our providers are all part of one team.  This team includes your primary Cardiologist (physician) and Advanced Practice Providers or APPs (Physician Assistants and Nurse Practitioners) who all work together to provide you with the care you need, when you need it.  Your next appointment:   As needed  Provider:   [place], MD  We recommend signing up for the patient portal called MyChart.  Sign up information is provided on this After Visit Summary.  MyChart is used to connect with patients for Virtual Visits (Telemedicine).  Patients are able to view lab/test results, encounter notes, upcoming appointments, etc.  Non-urgent messages can be sent to your provider as well.   To learn more about what you can do with MyChart, go to [email or web address].

## 2024-02-05 ENCOUNTER — Ambulatory Visit (HOSPITAL_BASED_OUTPATIENT_CLINIC_OR_DEPARTMENT_OTHER)
Admission: RE | Admit: 2024-02-05 | Discharge: 2024-02-05 | Disposition: A | Discharge status: Home / Self Care | Payer: Self-pay | Source: Ambulatory Visit | Attending: Cardiology | Admitting: Cardiology

## 2024-02-05 DIAGNOSIS — Z7689 Persons encountering health services in other specified circumstances: Secondary | ICD-10-CM | POA: Insufficient documentation

## 2024-02-05 DIAGNOSIS — R9431 Abnormal electrocardiogram [ECG] [EKG]: Secondary | ICD-10-CM | POA: Insufficient documentation

## 2024-02-05 DIAGNOSIS — Z8249 Family history of ischemic heart disease and other diseases of the circulatory system: Secondary | ICD-10-CM | POA: Insufficient documentation

## 2024-02-11 ENCOUNTER — Encounter: Payer: Self-pay | Admitting: Cardiology

## 2024-02-12 ENCOUNTER — Telehealth: Payer: Self-pay | Admitting: Cardiology

## 2024-02-12 ENCOUNTER — Ambulatory Visit: Payer: Self-pay | Admitting: Cardiology

## 2024-02-12 ENCOUNTER — Encounter: Payer: Self-pay | Admitting: *Deleted

## 2024-02-12 DIAGNOSIS — I77819 Aortic ectasia, unspecified site: Secondary | ICD-10-CM

## 2024-02-12 NOTE — Telephone Encounter (Signed)
Will make dr hochrein aware. 

## 2024-02-12 NOTE — Telephone Encounter (Signed)
 Patient called in upset that he hasn't got the results from the test. States that this is very unprofessional. Advise patient Dr. Lindi hasn't had a chance to review information. Patient states its unacceptable because it has been more then a week. Patient did ask to switch to another provider but he is unsure of which one he would like. Please advise
